# Patient Record
Sex: Female | Born: 1970 | Race: White | Hispanic: No | Marital: Single | Smoking: Current every day smoker
Health system: Southern US, Community
[De-identification: ages and names within clinical notes are randomized; demographics above are authoritative.]

---

## 1998-04-06 ENCOUNTER — Encounter: Admission: RE | Admit: 1998-04-06 | Discharge: 1998-07-05 | Payer: Self-pay | Admitting: Family Medicine

## 1998-07-02 ENCOUNTER — Encounter: Admission: RE | Admit: 1998-07-02 | Discharge: 1998-08-03 | Payer: Self-pay | Admitting: Family Medicine

## 1999-03-14 ENCOUNTER — Ambulatory Visit (HOSPITAL_COMMUNITY): Admission: RE | Admit: 1999-03-14 | Discharge: 1999-03-14 | Payer: Self-pay | Admitting: Orthopedic Surgery

## 1999-03-14 ENCOUNTER — Encounter: Payer: Self-pay | Admitting: Orthopedic Surgery

## 1999-07-06 ENCOUNTER — Other Ambulatory Visit: Admission: RE | Admit: 1999-07-06 | Discharge: 1999-07-06 | Payer: Self-pay | Admitting: Gynecology

## 2001-08-06 ENCOUNTER — Other Ambulatory Visit: Admission: RE | Admit: 2001-08-06 | Discharge: 2001-08-06 | Payer: Self-pay | Admitting: Gynecology

## 2001-08-21 ENCOUNTER — Encounter: Payer: Self-pay | Admitting: Family Medicine

## 2001-08-21 ENCOUNTER — Encounter: Admission: RE | Admit: 2001-08-21 | Discharge: 2001-08-21 | Payer: Self-pay | Admitting: Family Medicine

## 2002-09-10 ENCOUNTER — Other Ambulatory Visit: Admission: RE | Admit: 2002-09-10 | Discharge: 2002-09-10 | Payer: Self-pay | Admitting: Gynecology

## 2003-09-30 ENCOUNTER — Other Ambulatory Visit: Admission: RE | Admit: 2003-09-30 | Discharge: 2003-09-30 | Payer: Self-pay | Admitting: Gynecology

## 2004-10-18 ENCOUNTER — Other Ambulatory Visit: Admission: RE | Admit: 2004-10-18 | Discharge: 2004-10-18 | Payer: Self-pay | Admitting: Gynecology

## 2005-10-27 ENCOUNTER — Other Ambulatory Visit: Admission: RE | Admit: 2005-10-27 | Discharge: 2005-10-27 | Payer: Self-pay | Admitting: Gynecology

## 2006-10-09 ENCOUNTER — Other Ambulatory Visit: Admission: RE | Admit: 2006-10-09 | Discharge: 2006-10-09 | Payer: Self-pay | Admitting: Gynecology

## 2007-11-28 ENCOUNTER — Other Ambulatory Visit: Admission: RE | Admit: 2007-11-28 | Discharge: 2007-11-28 | Payer: Self-pay | Admitting: Gynecology

## 2007-12-20 ENCOUNTER — Ambulatory Visit: Payer: Self-pay | Admitting: Psychology

## 2007-12-26 ENCOUNTER — Ambulatory Visit: Payer: Self-pay | Admitting: Psychology

## 2008-01-11 ENCOUNTER — Ambulatory Visit: Payer: Self-pay | Admitting: Psychology

## 2010-11-29 ENCOUNTER — Other Ambulatory Visit: Payer: Self-pay | Admitting: Gynecology

## 2010-11-29 DIAGNOSIS — R928 Other abnormal and inconclusive findings on diagnostic imaging of breast: Secondary | ICD-10-CM

## 2010-12-06 ENCOUNTER — Ambulatory Visit
Admission: RE | Admit: 2010-12-06 | Discharge: 2010-12-06 | Disposition: A | Discharge status: Home / Self Care | Payer: BC Managed Care – PPO | Source: Ambulatory Visit | Attending: Gynecology | Admitting: Gynecology

## 2010-12-06 DIAGNOSIS — R928 Other abnormal and inconclusive findings on diagnostic imaging of breast: Secondary | ICD-10-CM

## 2010-12-28 ENCOUNTER — Other Ambulatory Visit: Payer: Self-pay | Admitting: Gastroenterology

## 2011-01-03 ENCOUNTER — Ambulatory Visit
Admission: RE | Admit: 2011-01-03 | Discharge: 2011-01-03 | Disposition: A | Discharge status: Home / Self Care | Payer: BC Managed Care – PPO | Source: Ambulatory Visit | Attending: Gastroenterology | Admitting: Gastroenterology

## 2011-12-09 ENCOUNTER — Other Ambulatory Visit: Payer: Self-pay | Admitting: Gynecology

## 2011-12-09 DIAGNOSIS — R928 Other abnormal and inconclusive findings on diagnostic imaging of breast: Secondary | ICD-10-CM

## 2011-12-15 ENCOUNTER — Other Ambulatory Visit: Payer: BC Managed Care – PPO

## 2011-12-20 ENCOUNTER — Ambulatory Visit
Admission: RE | Admit: 2011-12-20 | Discharge: 2011-12-20 | Disposition: A | Discharge status: Home / Self Care | Payer: BC Managed Care – PPO | Source: Ambulatory Visit | Attending: Gynecology | Admitting: Gynecology

## 2011-12-20 DIAGNOSIS — R928 Other abnormal and inconclusive findings on diagnostic imaging of breast: Secondary | ICD-10-CM

## 2013-02-27 IMAGING — US US BREAST L
1 series · 4 of 4 positions shown · non-contrast
Comparison: With priors

CLINICAL DATA: Abnormal left screening mammogram

DIGITAL DIAGNOSTIC LEFT MAMMOGRAM  AND LEFT BREAST ULTRASOUND:

[Series 1: us breast left · 4 of 4 slices shown]
[im 1/4]
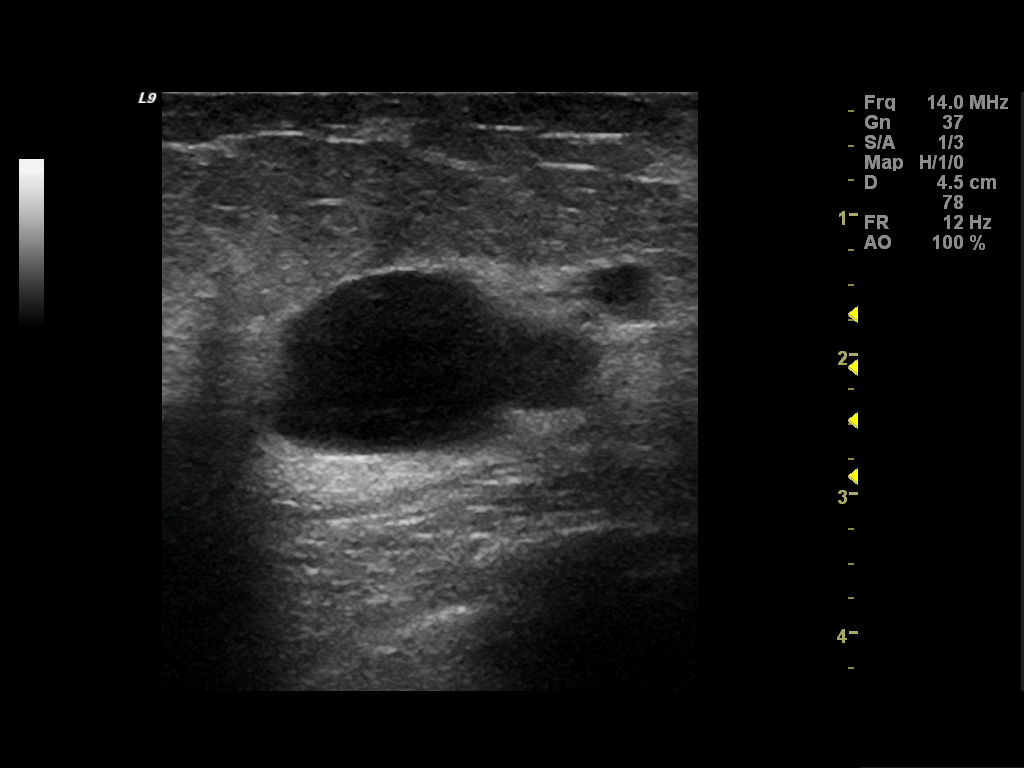
[im 2/4]
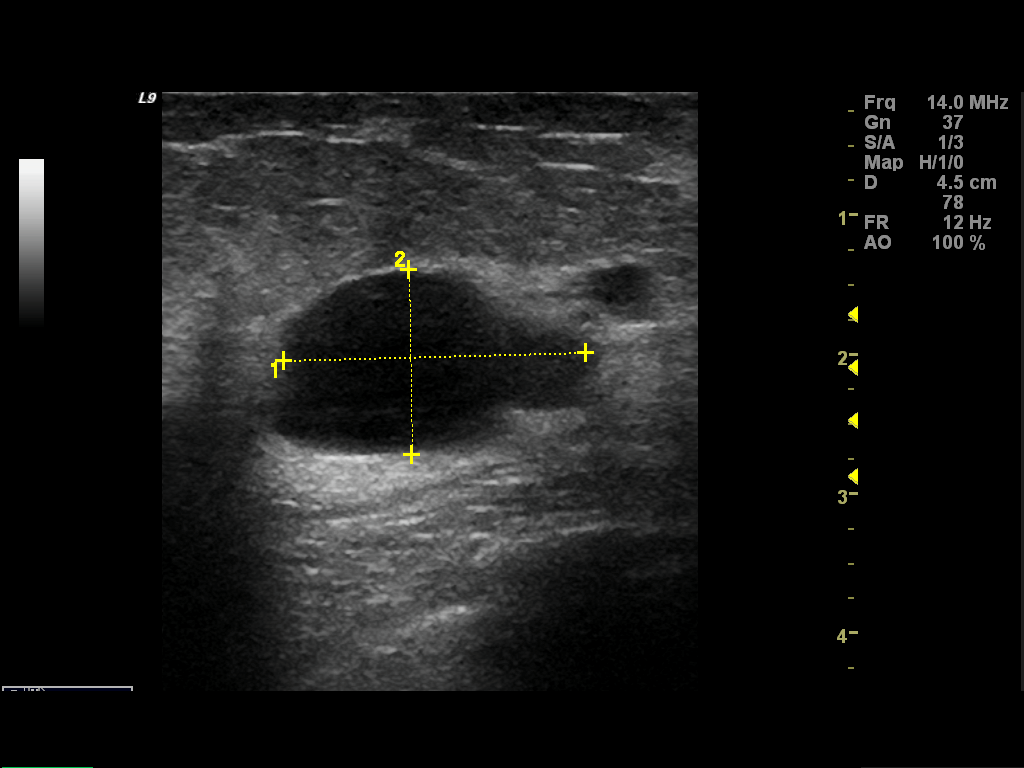
[im 3/4]
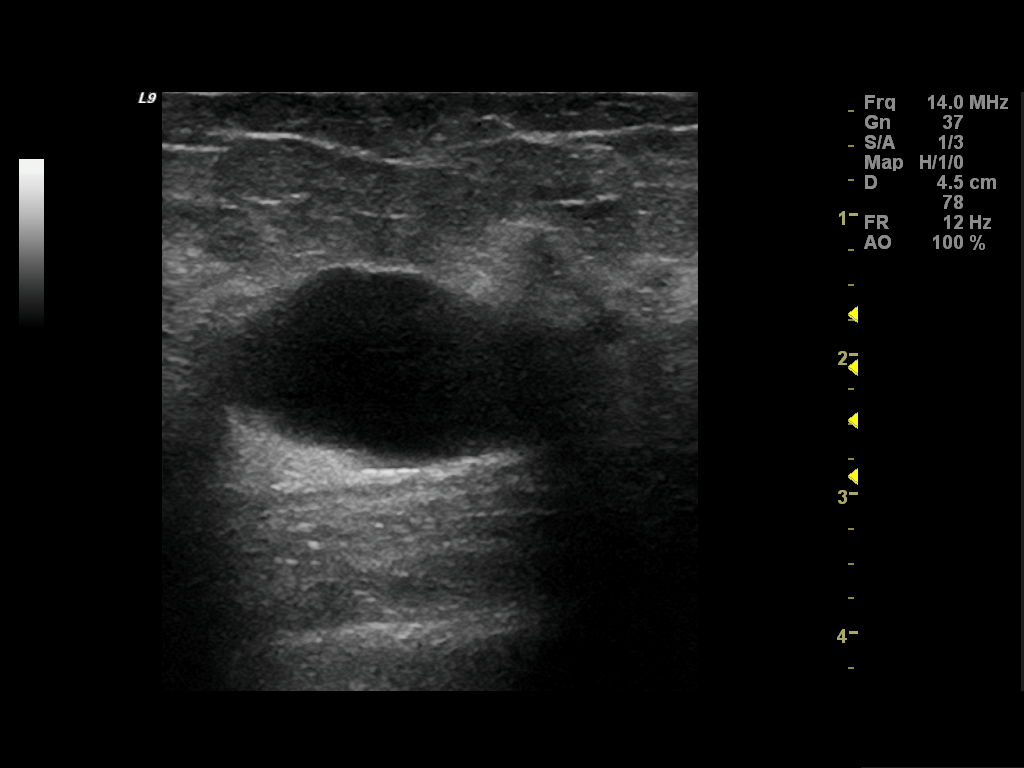
[im 4/4]
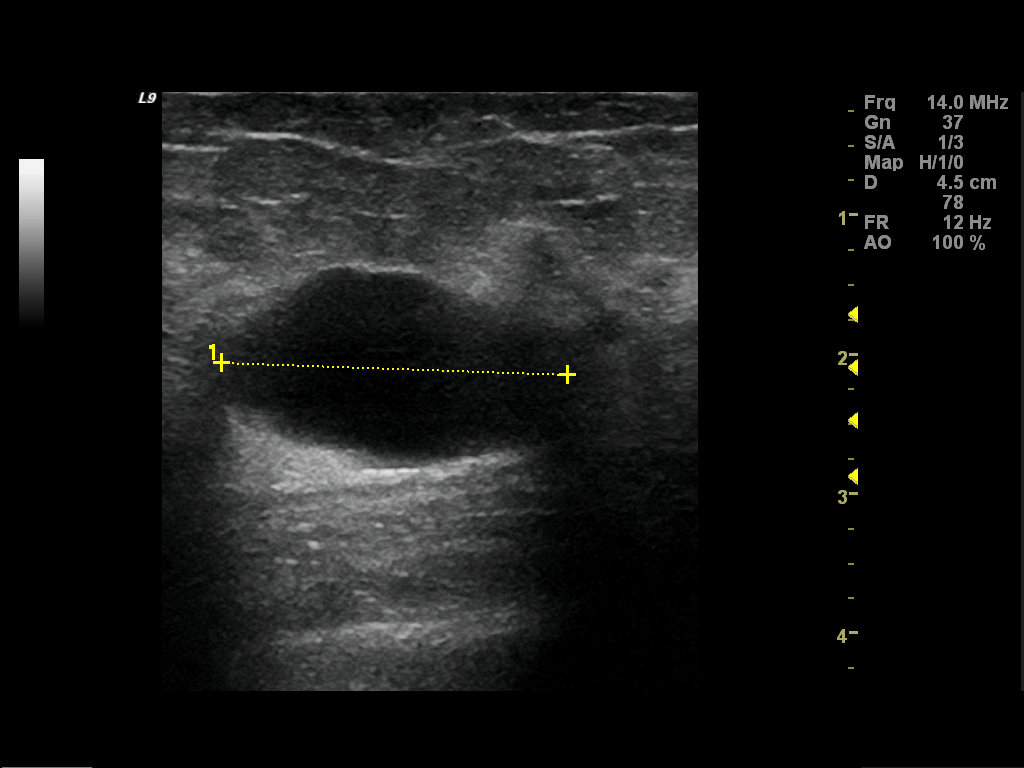

[4 of 4 positions shown; findings below may reference images not displayed]

FINDINGS: Spot compression views of the upper outer quadrant of
the left breast were performed.  There is persistence of an
obscured mass.  There is no associated malignant-type
microcalcifications.
.

On physical exam, I do not palpate a mass in the left breast.

Ultrasound is performed, showing there is a simple cyst in the 2
o'clock region of the left breast 4 cm from the nipple.  It
measures 2.2 x 1.3 x 2.5 cm.
IMPRESSION: Left breast cyst.  No evidence of malignancy.  Screening mammogram
in 1 year is recommended.

BI-RADS CATEGORY 2:  Benign finding(s).

## 2017-05-17 ENCOUNTER — Ambulatory Visit: Payer: 59 | Admitting: Allergy and Immunology

## 2017-05-17 ENCOUNTER — Encounter: Payer: Self-pay | Admitting: Allergy and Immunology

## 2017-05-17 VITALS — BP 124/78 | HR 79 | Ht 67.0 in | Wt 169.0 lb

## 2017-05-17 DIAGNOSIS — B86 Scabies: Secondary | ICD-10-CM

## 2017-05-17 DIAGNOSIS — L500 Allergic urticaria: Secondary | ICD-10-CM

## 2017-05-17 DIAGNOSIS — L308 Other specified dermatitis: Secondary | ICD-10-CM

## 2017-05-17 DIAGNOSIS — L989 Disorder of the skin and subcutaneous tissue, unspecified: Secondary | ICD-10-CM

## 2017-05-17 DIAGNOSIS — F17210 Nicotine dependence, cigarettes, uncomplicated: Secondary | ICD-10-CM | POA: Diagnosis not present

## 2017-05-17 DIAGNOSIS — J3089 Other allergic rhinitis: Secondary | ICD-10-CM | POA: Diagnosis not present

## 2017-05-17 MED ORDER — IVERMECTIN 3 MG PO TABS: ORAL_TABLET | ORAL | 0 refills | Status: AC

## 2017-05-17 MED ORDER — PERMETHRIN 5 % EX CREA: TOPICAL_CREAM | CUTANEOUS | 1 refills | Status: AC

## 2017-05-17 MED ORDER — MOMETASONE FUROATE 0.1 % EX CREA: 1.0000 "application " | TOPICAL_CREAM | Freq: Every day | CUTANEOUS | 5 refills | Status: AC

## 2017-05-17 MED ORDER — MONTELUKAST SODIUM 10 MG PO TABS: 10.0000 mg | ORAL_TABLET | Freq: Every day | ORAL | 5 refills | Status: AC

## 2017-05-17 MED ORDER — METHYLPREDNISOLONE ACETATE 80 MG/ML IJ SUSP
80.0000 mg | Freq: Once | INTRAMUSCULAR | Status: AC
  Administered 2017-05-17: 80 mg via INTRAMUSCULAR

## 2017-05-17 NOTE — Progress Notes (Signed)
Dear Dr. Valentina Lucks,  Thank you for referring Asanti Craigo to the [facility] on 05/17/2017.   Below is a summation of this patient's evaluation and recommendations.  Thank you for your referral. I will keep you informed about this patient's response to treatment.   If you have any questions please do not hesitate to contact me.   Sincerely,  Jessica Priest, MD Allergy / Immunology  [facility]   ______________________________________________________________________    NEW PATIENT NOTE  Referring Provider: Maurice Small, MD Primary Provider: Kerin Salen, MD Date of office visit: 05/17/2017    Subjective:   Chief Complaint:  Alexandra Wood (DOB: Jan 23, 1971) is a 46 y.o. female who presents to the clinic on 05/17/2017 with a chief complaint of Rash (Pt presents to discuss rash all over body starting in September. Pt saw PCP who prescribed Prednisone. Rash has returned.) .     HPI: [name] presents to this clinic in evaluation of a pruritic dermatitis.  Apparently in September 2018 she developed a itchy red dermatitis affecting initially her trunk that has globalized and has involved most of her body including her extremities at this point.  It is extremely itchy and she excoriates her skin and this disturbs her sleep.  She was treated initially with systemic steroids in September and had complete resolution of her issue only to relapse after discontinuing her steroids.  There is no associated systemic or constitutional symptoms.  There is no obvious trigger for this issue.  She has not had a significant environmental change or new medication or new supplements or anything different in her environment.  She has been given some hydroxyzine which does not really help her.  She does have a long history of GI pain for which she underwent an endoscopy in October and apparently everything was  normal.  Apparently her pain is associated with nausea and it appears to be a postprandial issue.  She does drink 2 coffees per day and has beer about 3 times per week.  She also has a history with sneezing and some occasional nasal congestion without any obvious trigger that appears to be a perennial issue.  She does smoke and has done so since the age of 55.  History reviewed. No pertinent past medical history.  History reviewed. No pertinent surgical history.  Allergies as of 05/17/2017   No Known Allergies     Medication List      betamethasone dipropionate 0.05 % ointment Commonly known as:  DIPROLENE APPLY TO AFFECTED AREA EVERY DAY   hydrOXYzine 25 MG tablet Commonly known as:  ATARAX/VISTARIL TAKE 1 TO 2 TABLETS BY MOUTH AT BEDTIME AS NEEDED ITCHY RASH       Review of systems negative except as noted in HPI / PMHx or noted below:  Review of Systems  Constitutional: Negative.   HENT: Negative.   Eyes: Negative.   Respiratory: Negative.   Cardiovascular: Negative.   Gastrointestinal: Negative.   Genitourinary: Negative.   Musculoskeletal: Negative.   Skin: Negative.   Neurological: Negative.   Endo/Heme/Allergies: Negative.   Psychiatric/Behavioral: Negative.     History reviewed. No pertinent family history.  Social History   Socioeconomic History  . Marital status: Single    Spouse name: Not on file  . Number of children: Not on file  . Years of education: Not on file  . Highest education level: Not on file  Social Needs  .  Financial resource strain: Not on file  . Food insecurity - worry: Not on file  . Food insecurity - inability: Not on file  . Transportation needs - medical: Not on file  . Transportation needs - non-medical: Not on file  Occupational History  . Occupation: Geophysical data processorHuman Resources Manager  Tobacco Use  . Smoking status: Current Every Day Smoker    Packs/day: 0.50    Years: 26.00    Pack years: 13.00    Types: Cigarettes  .  Smokeless tobacco: Never Used  Substance and Sexual Activity  . Alcohol use: Not on file  . Drug use: Not on file  . Sexual activity: Not on file  Other Topics Concern  . Not on file  Social History Narrative  . Not on file    Environmental and Social history  Lives in a condominium in with a dry environment, a dog located inside the household, carpet in the bedroom, no plastic on the bed, no plastic on the pillow, and actively smoking tobacco products.  She works as a Geophysical data processorhuman resources manager.  Objective:   Vitals:   05/17/17 0840  BP: 124/78  Pulse: 79  SpO2: 98%   Height: 5\' 7"  (170.2 cm) Weight: 169 lb (76.7 kg)  Physical Exam  Constitutional: She is well-developed, well-nourished, and in no distress.  HENT:  Head: Normocephalic. Head is without right periorbital erythema and without left periorbital erythema.  Right Ear: Tympanic membrane, external ear and ear canal normal.  Left Ear: Tympanic membrane, external ear and ear canal normal.  Nose: Nose normal. No mucosal edema or rhinorrhea.  Mouth/Throat: Oropharynx is clear and moist and mucous membranes are normal. No oropharyngeal exudate.  Eyes: Conjunctivae and lids are normal. Pupils are equal, round, and reactive to light.  Neck: Trachea normal. No tracheal deviation present. No thyromegaly present.  Cardiovascular: Normal rate, regular rhythm, S1 normal, S2 normal and normal heart sounds.  No murmur heard. Pulmonary/Chest: Effort normal. No stridor. No tachypnea. No respiratory distress. She has no wheezes. She has no rales. She exhibits no tenderness.  Abdominal: Soft. She exhibits no distension and no mass. There is no hepatosplenomegaly. There is no tenderness. There is no rebound and no guarding.  Musculoskeletal: She exhibits no edema or tenderness.  Lymphadenopathy:       Head (right side): No tonsillar adenopathy present.       Head (left side): No tonsillar adenopathy present.    She has no cervical  adenopathy.    She has no axillary adenopathy.  Neurological: She is alert. Gait normal.  Skin: Rash (Widespread scaly slightly lichenified erythematous dermatitis involving trunk and extremities and patchy distribution with multiple excoriated papules spread out across trunk and extremities.) noted. She is not diaphoretic. No erythema. No pallor. Nails show no clubbing.  Psychiatric: Mood and affect normal.    Diagnostics: Allergy skin tests were performed.  She demonstrated hypersensitivity against grasses, weeds, and dog.  Assessment and Plan:    1. Inflammatory dermatosis   2. Allergic urticaria   3. Scabies   4. Other allergic rhinitis   5. Tobacco smoker, less than 10 cigarettes per day     1.  Allergen avoidance measures  2.  Treat inflammation with the following:   A.  Depo-Medrol 80 IM delivered in clinic  B.  Shower followed by application of mometasone 0.1% daily  3.  Treat possible scabies:   A.  Elimite treatment today and repeat in 1 week  B.  Ivermectin 13mg   total dose today  4.  Treat overactive immune system:   A.  Cetirizine 10 mg twice a day  B.  Ranitidine 150 mg twice a day  C.  Montelukast 10 mg once a day  5.  Blood - CBC w/diff, CMP, SED, TSH, T4, T.P., Celiac screen, + U/A  6.  Return to clinic in 2 weeks or earlier if problem  7.  Use nicotine replacement to eliminate tobacco smoke exposure  8.  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  I will treat her empirically at least one time for scabies infestation as a cause for immunological hyperreactivity and also have her perform allergen avoidance measures as best as possible especially directed at her dog and treat her with a collection of medical therapy that will hopefully raise her itch threshold while we investigate for a worrisome systemic disease with the blood tests noted above.  In addition, I had  a talk with her today about the need to replace her tobacco smoking with a different hobby.  Jessica PriestEric J. Atha Mcbain, MD Allergy / Immunology [place] [facility] [place]

## 2017-05-17 NOTE — Patient Instructions (Addendum)
  1.  Allergen avoidance measures  2.  Treat inflammation with the following:   A.  Depo-Medrol 80 IM delivered in clinic  B.  Shower followed by application of mometasone 0.1% daily  3.  Treat possible scabies:   A.  Elimite treatment today and repeat in 1 week  B.  Ivermectin 13mg  total dose today  4.  Treat overactive immune system:   A.  Cetirizine 10 mg twice a day  B.  Ranitidine 150 mg twice a day  C.  Montelukast 10 mg once a day  5.  Blood - CBC w/diff, CMP, SED, TSH, T4, T.P., Celiac screen, + U/A  6.  Return to clinic in 2 weeks or earlier if problem  7.  Use nicotine replacement to eliminate tobacco smoke exposure  8.  Obtain flu vaccine every year

## 2017-05-21 LAB — COMPREHENSIVE METABOLIC PANEL
A/G RATIO: 1.5 (ref 1.2–2.2)
ALBUMIN: 4.3 g/dL (ref 3.5–5.5)
ALK PHOS: 84 IU/L (ref 39–117)
ALT: 27 IU/L (ref 0–32)
AST: 22 IU/L (ref 0–40)
BILIRUBIN TOTAL: 0.5 mg/dL (ref 0.0–1.2)
BUN / CREAT RATIO: 10 (ref 9–23)
BUN: 8 mg/dL (ref 6–24)
CHLORIDE: 102 mmol/L (ref 96–106)
CO2: 26 mmol/L (ref 20–29)
Calcium: 9.7 mg/dL (ref 8.7–10.2)
Creatinine, Ser: 0.84 mg/dL (ref 0.57–1.00)
GFR calc non Af Amer: 82 mL/min/{1.73_m2} (ref >59)
GFR, EST AFRICAN AMERICAN: 95 mL/min/{1.73_m2} (ref >59)
GLUCOSE: 117 mg/dL — AB (ref 65–99)
Globulin, Total: 2.8 g/dL (ref 1.5–4.5)
POTASSIUM: 5.1 mmol/L (ref 3.5–5.2)
Sodium: 140 mmol/L (ref 134–144)
Total Protein: 7.1 g/dL (ref 6.0–8.5)

## 2017-05-21 LAB — CBC WITH DIFFERENTIAL/PLATELET
BASOS ABS: 0.1 10*3/uL (ref 0.0–0.2)
Basos: 1 %
EOS (ABSOLUTE): 0.5 10*3/uL — AB (ref 0.0–0.4)
Eos: 7 %
Hematocrit: 45.3 % (ref 34.0–46.6)
Hemoglobin: 15.5 g/dL (ref 11.1–15.9)
Immature Grans (Abs): 0 10*3/uL (ref 0.0–0.1)
Immature Granulocytes: 0 %
LYMPHS ABS: 1.6 10*3/uL (ref 0.7–3.1)
LYMPHS: 20 %
MCH: 32.7 pg (ref 26.6–33.0)
MCHC: 34.2 g/dL (ref 31.5–35.7)
MCV: 96 fL (ref 79–97)
MONOS ABS: 0.5 10*3/uL (ref 0.1–0.9)
Monocytes: 7 %
NEUTROS ABS: 5.1 10*3/uL (ref 1.4–7.0)
Neutrophils: 65 %
PLATELETS: 287 10*3/uL (ref 150–379)
RBC: 4.74 x10E6/uL (ref 3.77–5.28)
RDW: 13.2 % (ref 12.3–15.4)
WBC: 7.8 10*3/uL (ref 3.4–10.8)

## 2017-05-21 LAB — T4, FREE: FREE T4: 1.19 ng/dL (ref 0.82–1.77)

## 2017-05-21 LAB — URINALYSIS
Bilirubin, UA: NEGATIVE
Glucose, UA: NEGATIVE
Ketones, UA: NEGATIVE
LEUKOCYTES UA: NEGATIVE
NITRITE UA: NEGATIVE
PH UA: 5.5 (ref 5.0–7.5)
Protein, UA: NEGATIVE
RBC, UA: NEGATIVE
Specific Gravity, UA: 1.011 (ref 1.005–1.030)
UUROB: 0.2 mg/dL (ref 0.2–1.0)

## 2017-05-21 LAB — CELIAC DISEASE ANTIBODY SCREEN
ANTIGLIADIN ABS, IGA: 9 U (ref 0–19)
IgA/Immunoglobulin A, Serum: 271 mg/dL (ref 87–352)

## 2017-05-21 LAB — THYROID PEROXIDASE ANTIBODY: Thyroperoxidase Ab SerPl-aCnc: 15 IU/mL (ref 0–34)

## 2017-05-21 LAB — TSH: TSH: 2.01 u[IU]/mL (ref 0.450–4.500)

## 2017-05-21 LAB — SEDIMENTATION RATE: SED RATE: 2 mm/h (ref 0–32)

## 2017-05-22 ENCOUNTER — Encounter: Payer: Self-pay | Admitting: Allergy and Immunology

## 2017-09-07 ENCOUNTER — Telehealth: Payer: Self-pay

## 2017-09-07 NOTE — Telephone Encounter (Signed)
Patient had labs back in November and she did not receive her results.  Please Advise

## 2017-09-07 NOTE — Telephone Encounter (Signed)
I have spoken with the patient and advised her of the lab results.

## 2017-09-07 NOTE — Telephone Encounter (Signed)
Please inform patient that all blood tests were normal except for signs of her allergic disease.  Please apologize to her as her blood tests should not have been lost in the electronic medical record system as they apparently were last November.  If she is not doing well then she should come back in to see me for further evaluation and treatment.

## 2017-09-07 NOTE — Telephone Encounter (Signed)
Dr Kozlow please advise 

## 2019-02-18 ENCOUNTER — Other Ambulatory Visit: Payer: Self-pay | Admitting: Obstetrics and Gynecology

## 2019-02-18 DIAGNOSIS — R928 Other abnormal and inconclusive findings on diagnostic imaging of breast: Secondary | ICD-10-CM

## 2019-03-06 ENCOUNTER — Ambulatory Visit
Admission: RE | Admit: 2019-03-06 | Discharge: 2019-03-06 | Disposition: A | Discharge status: Home / Self Care | Payer: 59 | Source: Ambulatory Visit | Attending: Obstetrics and Gynecology | Admitting: Obstetrics and Gynecology

## 2019-03-06 ENCOUNTER — Other Ambulatory Visit: Payer: Self-pay

## 2019-03-06 DIAGNOSIS — R928 Other abnormal and inconclusive findings on diagnostic imaging of breast: Secondary | ICD-10-CM

## 2019-09-26 ENCOUNTER — Ambulatory Visit: Payer: 59 | Attending: Internal Medicine

## 2019-09-26 DIAGNOSIS — Z23 Encounter for immunization: Secondary | ICD-10-CM

## 2019-09-26 NOTE — Progress Notes (Signed)
   Covid-19 Vaccination Clinic  Name:  Alexandra Wood    MRN: [id number] DOB: April 05, 1971  09/26/2019  [name] was observed post Covid-19 immunization for 15 minutes without incident. She was provided with Vaccine Information Sheet and instruction to access the V-Safe system.   [name] was instructed to call 911 with any severe reactions post vaccine: Marland Kitchen Difficulty breathing  . Swelling of face and throat  . A fast heartbeat  . A bad rash all over body  . Dizziness and weakness   Immunizations Administered    Name Date Dose VIS Date Route   Pfizer COVID-19 Vaccine 09/26/2019  4:30 PM 0.3 mL 06/07/2019 Intramuscular   Manufacturer: [employer], [employer]   Lot: [id number]   NDC: [address]-2     Patient wishes it noted that she has hypertension and anxiety

## 2019-10-22 ENCOUNTER — Ambulatory Visit: Payer: 59 | Attending: Internal Medicine

## 2019-10-22 DIAGNOSIS — Z23 Encounter for immunization: Secondary | ICD-10-CM

## 2019-10-22 NOTE — Progress Notes (Signed)
   Covid-19 Vaccination Clinic  Name:  Alexandra Wood    MRN: [id number] DOB: 12-30-1970  10/22/2019  [name] was observed post Covid-19 immunization for 15 minutes without incident. She was provided with Vaccine Information Sheet and instruction to access the V-Safe system.   [name] was instructed to call 911 with any severe reactions post vaccine: Marland Kitchen Difficulty breathing  . Swelling of face and throat  . A fast heartbeat  . A bad rash all over body  . Dizziness and weakness   Immunizations Administered    Name Date Dose VIS Date Route   Pfizer COVID-19 Vaccine 10/22/2019  4:04 PM 0.3 mL 08/21/2018 Intramuscular   Manufacturer: [employer], [employer]   Lot: [id number]   NDC: [address]-1

## 2020-04-29 ENCOUNTER — Emergency Department (HOSPITAL_BASED_OUTPATIENT_CLINIC_OR_DEPARTMENT_OTHER)
Admission: EM | Admit: 2020-04-29 | Discharge: 2020-04-29 | Disposition: A | Discharge status: Home / Self Care | Payer: 59 | Attending: Emergency Medicine | Admitting: Emergency Medicine

## 2020-04-29 ENCOUNTER — Other Ambulatory Visit: Payer: Self-pay

## 2020-04-29 ENCOUNTER — Encounter (HOSPITAL_BASED_OUTPATIENT_CLINIC_OR_DEPARTMENT_OTHER): Payer: Self-pay | Admitting: *Deleted

## 2020-04-29 ENCOUNTER — Emergency Department (HOSPITAL_BASED_OUTPATIENT_CLINIC_OR_DEPARTMENT_OTHER): Payer: 59

## 2020-04-29 ENCOUNTER — Other Ambulatory Visit (HOSPITAL_BASED_OUTPATIENT_CLINIC_OR_DEPARTMENT_OTHER): Payer: Self-pay | Admitting: Emergency Medicine

## 2020-04-29 DIAGNOSIS — S3992XA Unspecified injury of lower back, initial encounter: Secondary | ICD-10-CM | POA: Diagnosis present

## 2020-04-29 DIAGNOSIS — Y92009 Unspecified place in unspecified non-institutional (private) residence as the place of occurrence of the external cause: Secondary | ICD-10-CM | POA: Insufficient documentation

## 2020-04-29 DIAGNOSIS — S32020A Wedge compression fracture of second lumbar vertebra, initial encounter for closed fracture: Secondary | ICD-10-CM | POA: Insufficient documentation

## 2020-04-29 DIAGNOSIS — Y9356 Activity, jumping rope: Secondary | ICD-10-CM | POA: Diagnosis not present

## 2020-04-29 DIAGNOSIS — S32010A Wedge compression fracture of first lumbar vertebra, initial encounter for closed fracture: Secondary | ICD-10-CM | POA: Insufficient documentation

## 2020-04-29 DIAGNOSIS — F17210 Nicotine dependence, cigarettes, uncomplicated: Secondary | ICD-10-CM | POA: Diagnosis not present

## 2020-04-29 DIAGNOSIS — W1789XA Other fall from one level to another, initial encounter: Secondary | ICD-10-CM | POA: Insufficient documentation

## 2020-04-29 MED ORDER — NAPROXEN 500 MG PO TABS: 500.0000 mg | ORAL_TABLET | Freq: Two times a day (BID) | ORAL | 0 refills | Status: DC

## 2020-04-29 MED ORDER — ONDANSETRON 4 MG PO TBDP
4.0000 mg | ORAL_TABLET | Freq: Once | ORAL | Status: AC
  Administered 2020-04-29: 4 mg via ORAL
  Filled 2020-04-29: qty 1

## 2020-04-29 MED ORDER — HYDROCODONE-ACETAMINOPHEN 5-325 MG PO TABS
1.0000 | ORAL_TABLET | Freq: Four times a day (QID) | ORAL | 0 refills | Status: DC | PRN
Start: 2020-04-29 — End: 2020-04-29

## 2020-04-29 MED ORDER — HYDROMORPHONE HCL 1 MG/ML IJ SOLN
1.0000 mg | Freq: Once | INTRAMUSCULAR | Status: AC
  Administered 2020-04-29: 1 mg via INTRAVENOUS
  Filled 2020-04-29: qty 1

## 2020-04-29 MED FILL — HYDROCODON-APAP 5-325: 5-325 | 3 days supply | Qty: 20 | Fill #0

## 2020-04-29 MED FILL — NAPROXEN 500 MG TABS: 500 | 10 days supply | Qty: 20 | Fill #0

## 2020-04-29 NOTE — ED Notes (Signed)
She is able to slowly and capably ambulate. We go to our pharmacy on her way to her vehicle. Her significant other is driving.

## 2020-04-29 NOTE — Progress Notes (Signed)
Orthopedic Tech Progress Note Patient Details:  Joniece Smotherman Sep 22, 1970 [id number] Spoke with MD KNAPP about patient needing a BACK BRACE. Called In order to [facility] for a TLSO  Patient ID: Alvenia Treese, female   DOB: Dec 06, 1970, 49 y.o.   MRN: [id number]   Donald Pore 04/29/2020, 10:20 AM

## 2020-04-29 NOTE — ED Notes (Signed)
The brace tech. Has just arrived and is fitting her brace as I write this.

## 2020-04-29 NOTE — ED Triage Notes (Addendum)
Pt jumped out from her 2-storey balcony this morning because she got locked out without a phone.  Complaint of back pain with difficulty ambulating.

## 2020-04-29 NOTE — Discharge Instructions (Signed)
Make sure to wear the spine brace as we discussed.  Call the neurosurgery office to schedule an appointment.  Take the medications as needed for pain

## 2020-04-29 NOTE — ED Notes (Signed)
She is in some discomfort whilst back brace is being applied. She denies need for any more pain med at this time. Pulse and b/p elevated; will recheck after brace is fitted.

## 2020-04-29 NOTE — ED Provider Notes (Signed)
[facility] EMERGENCY DEPARTMENT Provider Note   CSN: [id number] Arrival date & time: 04/29/20  0254     History Chief Complaint  Patient presents with   Back Pain    Alexandra Wood is a 49 y.o. female.  HPI   Patient states she accidentally got locked out on her second story balcony this morning.  She did not have her phone with her.  Patient had to jump off the balcony so she can try to get a key from a neighbor.  Patient is now having severe pain in her lower back.  She is unable to find a comfortable position.  Movement increases the pain she denies any numbness or weakness in her legs.  Patient denies any loss of consciousness.  She did bruise her left cheek but does not think that is too serious.  She denies any neck pain.  She denies any headache.  She denies chest pain or abdominal pain.  History reviewed. No pertinent past medical history.  There are no problems to display for this patient.   History reviewed. No pertinent surgical history.   OB History   No obstetric history on file.     History reviewed. No pertinent family history.  Social History   Tobacco Use   Smoking status: Current Every Day Smoker    Packs/day: 0.50    Years: 26.00    Pack years: 13.00    Types: Cigarettes   Smokeless tobacco: Never Used  Substance Use Topics   Alcohol use: Yes    Comment: occasionally   Drug use: Never    Home Medications Prior to Admission medications   Medication Sig Start Date End Date Taking? Authorizing Provider  betamethasone dipropionate (DIPROLENE) 0.05 % ointment APPLY TO AFFECTED AREA EVERY DAY 03/07/17   [provider]  HYDROcodone-acetaminophen (NORCO/VICODIN) 5-325 MG tablet Take 1-2 tablets by mouth every 6 (six) hours as needed. 04/29/20   Linwood Dibbles, MD  hydrOXYzine (ATARAX/VISTARIL) 25 MG tablet TAKE 1 TO 2 TABLETS BY MOUTH AT BEDTIME AS NEEDED ITCHY RASH 03/07/17   [provider]  ivermectin (STROMECTOL) 3 MG  TABS tablet Take 13 mg total dose today 05/17/17   Kozlow, Alvira Philips, MD  mometasone (ELOCON) 0.1 % cream Apply 1 application topically daily. 05/17/17   Kozlow, Alvira Philips, MD  montelukast (SINGULAIR) 10 MG tablet Take 1 tablet (10 mg total) by mouth at bedtime. 05/17/17   Kozlow, Alvira Philips, MD  naproxen (NAPROSYN) 500 MG tablet Take 1 tablet (500 mg total) by mouth 2 (two) times daily with a meal. As needed for pain 04/29/20   Linwood Dibbles, MD  permethrin (ELIMITE) 5 % cream Apply one application today and repeat in one week 05/17/17   Kozlow, Alvira Philips, MD    Allergies    Patient has no known allergies.  Review of Systems   Review of Systems  All other systems reviewed and are negative.   Physical Exam Updated Vital Signs BP 105/66    Pulse 81    Temp 98.4 F (36.9 C) (Oral)    Resp 18 Comment: 1027a   Ht 1.702 m (5\' 7" )    Wt 74.8 kg    SpO2 98%    BMI 25.84 kg/m   Physical Exam Vitals and nursing note reviewed.  Constitutional:      General: She is not in acute distress.    Appearance: She is well-developed.  HENT:     Head: Normocephalic.     Comments:  Mild tenderness palpation left cheek    Right Ear: External ear normal.     Left Ear: External ear normal.  Eyes:     General: No scleral icterus.       Right eye: No discharge.        Left eye: No discharge.     Conjunctiva/sclera: Conjunctivae normal.  Neck:     Trachea: No tracheal deviation.  Cardiovascular:     Rate and Rhythm: Normal rate and regular rhythm.  Pulmonary:     Effort: Pulmonary effort is normal. No respiratory distress.     Breath sounds: Normal breath sounds. No stridor. No wheezing or rales.  Abdominal:     General: Bowel sounds are normal. There is no distension.     Palpations: Abdomen is soft.     Tenderness: There is no abdominal tenderness. There is no guarding or rebound.  Musculoskeletal:     Cervical back: Normal and neck supple. No swelling, deformity or tenderness. Normal range of motion.      Thoracic back: Tenderness present. No deformity. Decreased range of motion. No scoliosis.     Lumbar back: Tenderness present. No swelling or deformity. Normal range of motion.     Comments: Tenderness palpation lower thoracic upper lumbar region no specific area of direct midline tenderness; no tenderness to palpation of bilateral upper and bilateral lower extremities  Skin:    General: Skin is warm and dry.     Findings: No rash.  Neurological:     Mental Status: She is alert.     Cranial Nerves: No cranial nerve deficit (no facial droop, extraocular movements intact, no slurred speech).     Sensory: No sensory deficit.     Motor: No abnormal muscle tone or seizure activity.     Coordination: Coordination normal.     ED Results / Procedures / Treatments   Labs (all labs ordered are listed, but only abnormal results are displayed) Labs Reviewed - No data to display  EKG None  Radiology DG Thoracic Spine 2 View  Result Date: 04/29/2020 CLINICAL DATA:  Back pain post fall, jumped from second story balcony after got locked out of apartment, severe upper and lower back pain EXAM: THORACIC SPINE 2 VIEWS COMPARISON:  None FINDINGS: Twelve pairs of ribs. Osseous mineralization low normal. No fracture, subluxation, or bone destruction identified. No widening of paraspinal lines IMPRESSION: No acute abnormalities. Electronically Signed   By: Ulyses Southward M.D.   On: 04/29/2020 08:58   DG Lumbar Spine Complete  Result Date: 04/29/2020 CLINICAL DATA:  Back pain post fall, jumped from second story balcony after got locked out of apartment, severe upper and lower back pain EXAM: LUMBAR SPINE - COMPLETE 4+ VIEW COMPARISON:  None FINDINGS: Osseous mineralization low normal. Five non-rib-bearing lumbar vertebra. Mild superior endplate compression fractures of L1 and L2, slightly greater at L2. No additional fracture or subluxation. Tiny superior endplate spur on LEFT at L4. SI joints symmetric.  IMPRESSION: Mild superior endplate compression fractures of L1 and L2 as above. Electronically Signed   By: Ulyses Southward M.D.   On: 04/29/2020 09:00   CT Lumbar Spine Wo Contrast  Result Date: 04/29/2020 CLINICAL DATA:  49 year old female with L1 and L2 vertebral body fractures after fall/jumped from 2nd story balcony. EXAM: CT LUMBAR SPINE WITHOUT CONTRAST TECHNIQUE: Multidetector CT imaging of the lumbar spine was performed without intravenous contrast administration. Multiplanar CT image reconstructions were also generated. COMPARISON:  Thoracolumbar radiographs this morning. FINDINGS: Segmentation:  Normal. Alignment: Stable straightening of lumbar lordosis. No spondylolisthesis. Vertebrae: The visible lower ribs in the T12 vertebra are intact. Comminuted L1 superior endplate and anterior vertebral body compression fracture with up to 25% loss of vertebral body height. No retropulsion. L1 pedicles and posterior elements appear intact. Comminuted L2 superior endplate and anterior body fracture with 35% loss of height. Minimal retropulsion of the posterosuperior endplate. L2 pedicles and posterior elements remain intact. L3 through L5, visible sacrum and SI joints remain intact. Paraspinal and other soft tissues: Minimal lumbar paraspinal soft tissue edema or hematoma at L1 and L2. Other lumbar paraspinal soft tissues are within normal limits. Negative visible noncontrast abdominal viscera. 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 No significant lumbar spinal stenosis by CT. IMPRESSION: 1. Comminuted L1 and L2 compression fractures with 25% and 35% loss of height, respectively. Minimal retropulsion at the latter with no spinal stenosis or other complicating features. 2. T12 and other lumbar levels remain intact. If there is continued upper back pain a noncontrast Thoracic Spine MRI would be most sensitive for subtle thoracic compression fractures. Electronically Signed   By: Odessa Fleming M.D.    On: 04/29/2020 09:39    Procedures Procedures (including critical care time)  Medications Ordered in ED Medications  HYDROmorphone (DILAUDID) injection 1 mg (1 mg Intravenous Given 04/29/20 0853)  HYDROmorphone (DILAUDID) injection 1 mg (1 mg Intravenous Given 04/29/20 1024)    ED Course  I have reviewed the triage vital signs and the nursing notes.  Pertinent labs & imaging results that were available during my care of the patient were reviewed by me and considered in my medical decision making (see chart for details).  Clinical Course as of Apr 29 1048  Wed Apr 29, 2020  4163 Plain films show l1 l2 endplate fx.   Will ct to confirm extent of fracture   [JK]  0953 CT x-ray findings reviewed.  L1 and L2 fractures, firm.  No complicating elements noted   [JK]    Clinical Course User Index [JK] Linwood Dibbles, MD   MDM Rules/Calculators/A&P                         Symptoms concerning for the possibility of thoracic or lumbar compression fracture versus muscular strain.  Patient does not have any cervical spine tenderness.  Discussed her facial contusion.  Patient does not want any x-rays.  Cannot exclude the possibility of a nondisplaced fracture based on her physical exam but there is no significant swelling and no deformity.  Patient's x-rays do confirm L1 and L2 spinal compression fractures.  Patient is neurologically intact with no evidence of lower extremity numbness or weakness.  CT scan does not show any evidence of spinal cord injury or impingement.  Plan on discharge home with a TLSO brace. Waiting for pt to be fitted in the ED.   Patient also be given prescriptions for pain.  Discussed outpatient follow-up with neurosurgery. Final Clinical Impression(s) / ED Diagnoses Final diagnoses:  Closed compression fracture of body of L1 vertebra (HCC)  Compression fracture of L2, closed, initial encounter (HCC)    Rx / DC Orders ED Discharge Orders         Ordered    naproxen  (NAPROSYN) 500 MG tablet  2 times daily with meals        04/29/20 1048    HYDROcodone-acetaminophen (NORCO/VICODIN) 5-325 MG tablet  Every 6 hours PRN  04/29/20 1048           Linwood DibblesKnapp, Eleftheria Taborn, MD 04/29/20 1049

## 2020-04-29 NOTE — ED Notes (Signed)
We are taking our time, as she has had some issues with nausea. Will attempt to d/c soon.

## 2020-06-09 ENCOUNTER — Other Ambulatory Visit (HOSPITAL_BASED_OUTPATIENT_CLINIC_OR_DEPARTMENT_OTHER): Payer: Self-pay | Admitting: Neurosurgery

## 2020-06-09 MED FILL — NAPROXEN 500 MG TABS: 500 | 22 days supply | Qty: 45 | Fill #0

## 2022-07-22 IMAGING — CT CT L SPINE W/O CM
3 series · 12 of 33 positions shown, 14 images · non-contrast
Comparison: Thoracolumbar radiographs this morning.

CLINICAL DATA: 49-year-old female with L1 and L2 vertebral body
fractures after fall/jumped from 2nd story balcony.

EXAM:
CT LUMBAR SPINE WITHOUT CONTRAST
TECHNIQUE: Multidetector CT imaging of the lumbar spine was performed without
intravenous contrast administration. Multiplanar CT image
reconstructions were also generated.

[Series 4: l spine soft · axial · 0.33mm/px · z∈[-268,-102]mm · 4 of 121 slices shown, 5 images]
[im 19/121  soft-tissue]
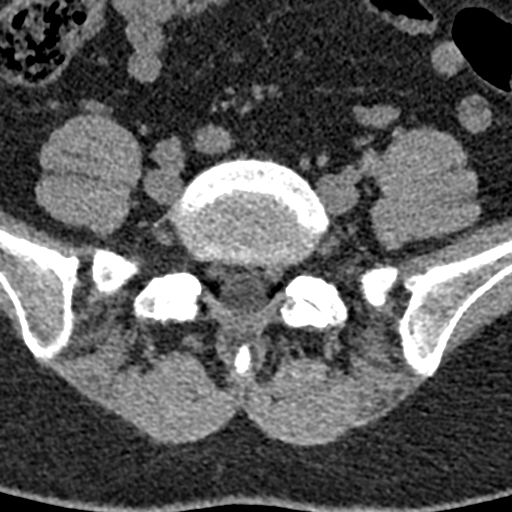
[im 19/121  bone]
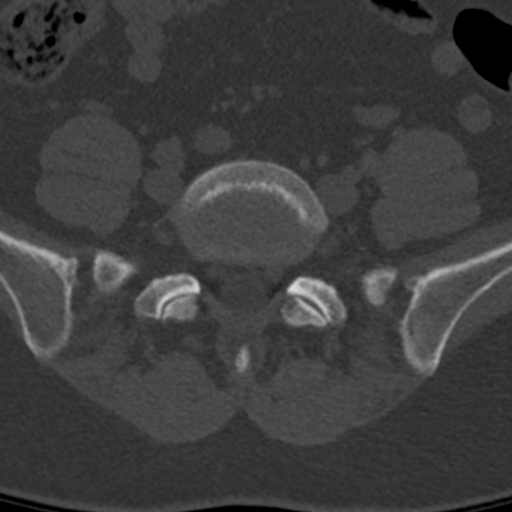
[im 47/121  bone]
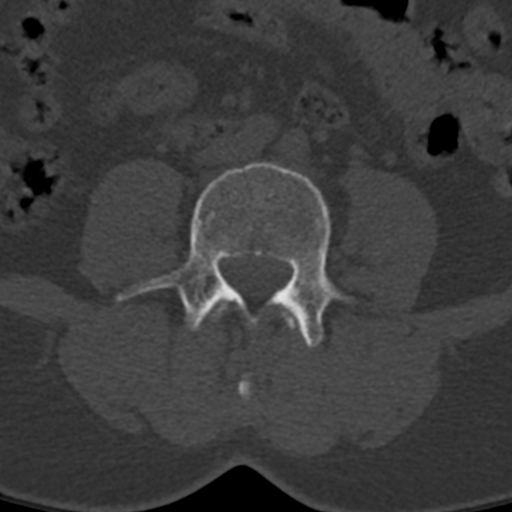
[im 74/121  bone]
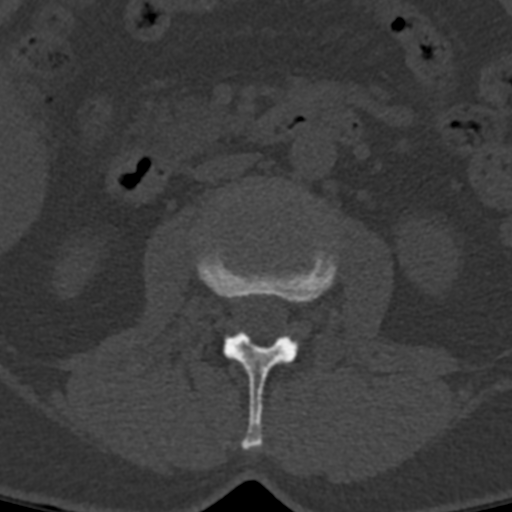
[im 102/121  bone]
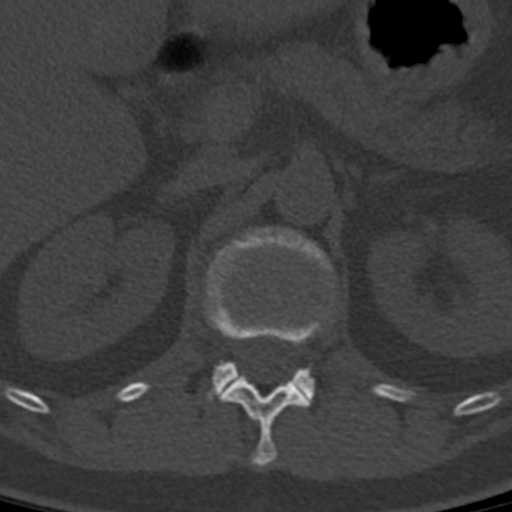

[Series 5: sagittal bone · sagittal · 0.33mm/px · 5 of 61 slices shown, 6 images]
[im 21/61  bone]
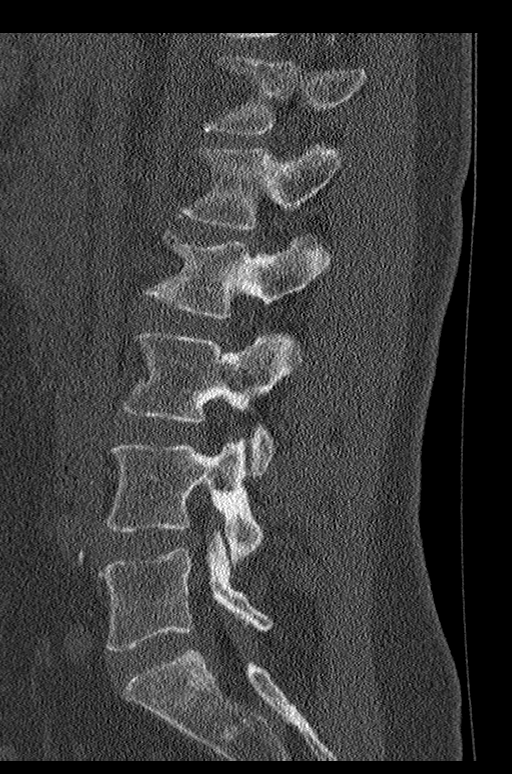
[im 26/61  bone]
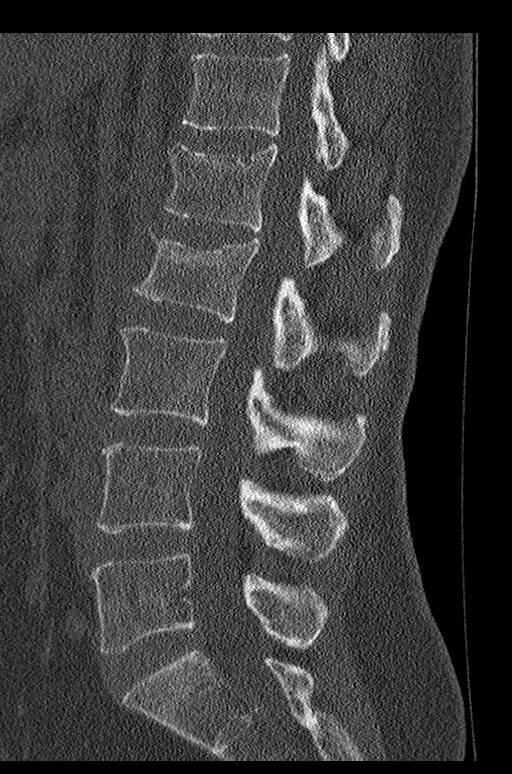
[im 31/61  soft-tissue]
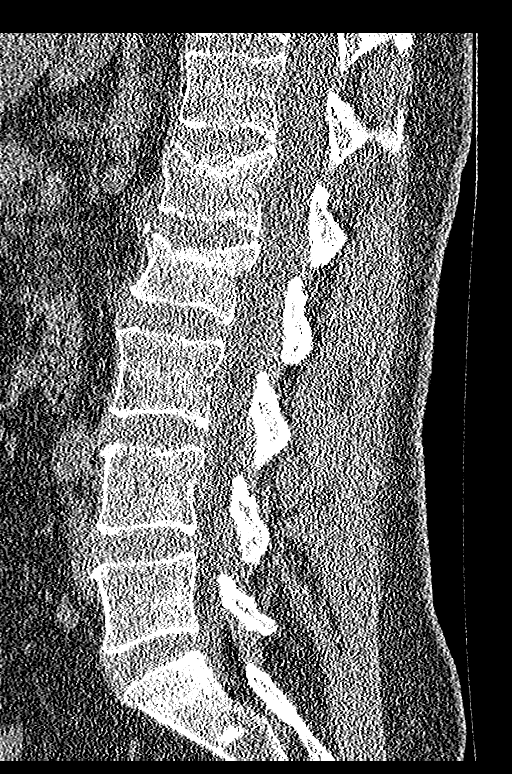
[im 31/61  bone]
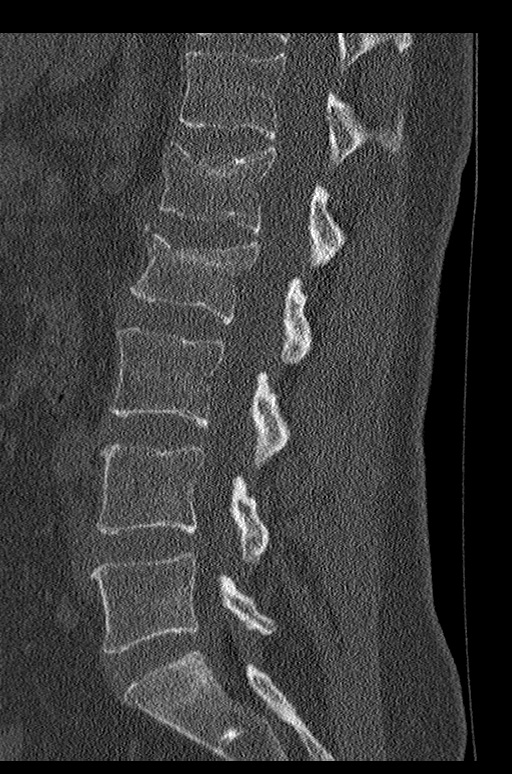
[im 36/61  bone]
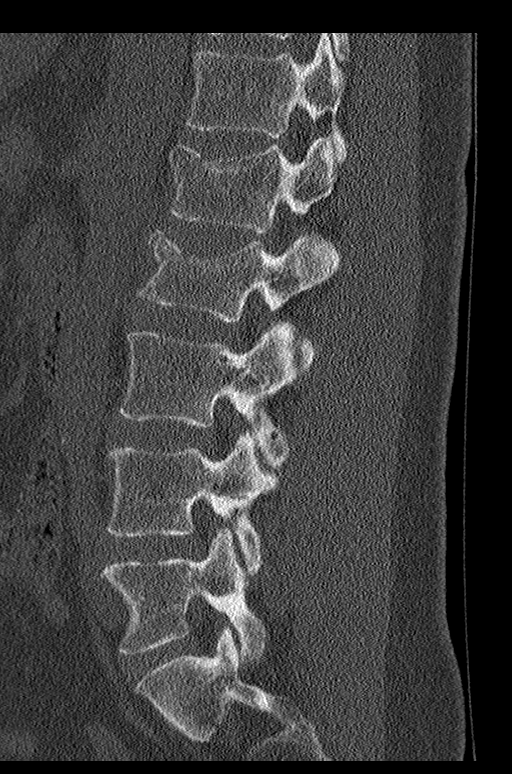
[im 41/61  bone]
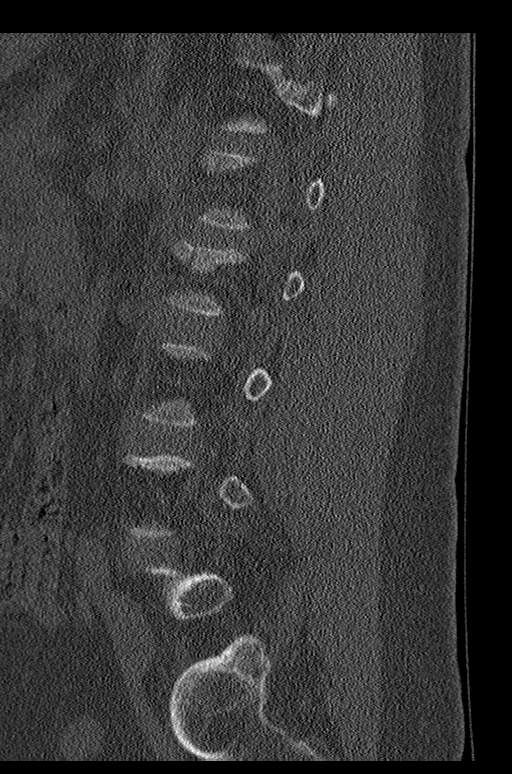

[Series 6: coronal bone · coronal · 0.35mm/px · 3 of 76 slices shown]
[im 16/76  bone]
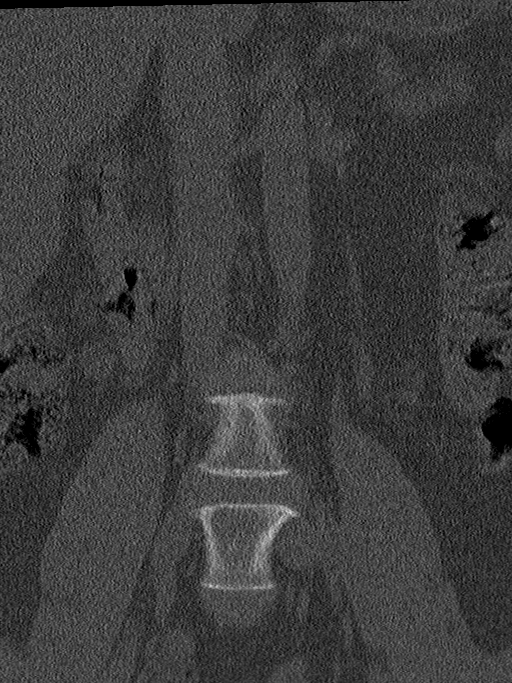
[im 31/76  bone]
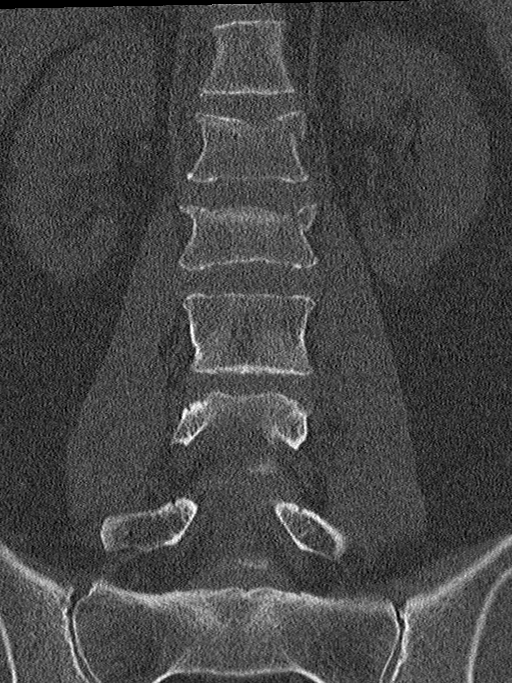
[im 46/76  bone]
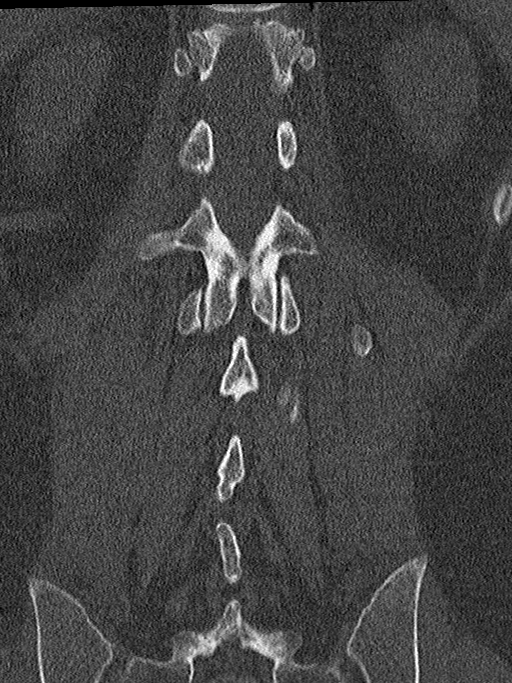

[12 of 33 positions shown; findings below may reference images not displayed]

FINDINGS: Segmentation: Normal.

Alignment: Stable straightening of lumbar lordosis. No
spondylolisthesis.

Vertebrae: The visible lower ribs in the T12 vertebra are intact.

Comminuted L1 superior endplate and anterior vertebral body
compression fracture with up to 25% loss of vertebral body height.
No retropulsion. L1 pedicles and posterior elements appear intact.

Comminuted L2 superior endplate and anterior body fracture with 35%
loss of height. Minimal retropulsion of the posterosuperior
endplate. L2 pedicles and posterior elements remain intact.

L3 through L5, visible sacrum and SI joints remain intact.

Paraspinal and other soft tissues: Minimal lumbar paraspinal soft
tissue edema or hematoma at L1 and L2.

Other lumbar paraspinal soft tissues are within normal limits.
Negative visible noncontrast abdominal viscera.

Disc levels: Mild for age underlying lumbar spine degeneration. No
significant lumbar spinal stenosis by CT.
IMPRESSION: 1. Comminuted L1 and L2 compression fractures with 25% and 35% loss
of height, respectively.
Minimal retropulsion at the latter with no spinal stenosis or other
complicating features.

2. T12 and other lumbar levels remain intact. If there is continued
upper back pain a noncontrast Thoracic Spine MRI would be most
sensitive for subtle thoracic compression fractures.
